# Patient Record
Sex: Female | Born: 1975 | Race: White | Hispanic: No | Marital: Married | Smoking: Former smoker
Health system: Southern US, Community
[De-identification: ages and names within clinical notes are randomized; demographics above are authoritative.]

## PROBLEM LIST (undated history)

## (undated) DIAGNOSIS — Z87442 Personal history of urinary calculi: Secondary | ICD-10-CM

## (undated) DIAGNOSIS — R51 Headache: Secondary | ICD-10-CM

## (undated) HISTORY — DX: Headache: R51

## (undated) HISTORY — PX: WISDOM TOOTH EXTRACTION: SHX21

---

## 2000-11-17 ENCOUNTER — Encounter: Payer: Self-pay | Admitting: Emergency Medicine

## 2000-11-17 ENCOUNTER — Emergency Department (HOSPITAL_COMMUNITY): Admission: EM | Admit: 2000-11-17 | Discharge: 2000-11-17 | Payer: Self-pay | Admitting: Emergency Medicine

## 2001-07-02 ENCOUNTER — Other Ambulatory Visit: Admission: RE | Admit: 2001-07-02 | Discharge: 2001-07-02 | Payer: Self-pay | Admitting: Family Medicine

## 2002-06-29 ENCOUNTER — Other Ambulatory Visit: Admission: RE | Admit: 2002-06-29 | Discharge: 2002-06-29 | Payer: Self-pay | Admitting: Family Medicine

## 2003-06-07 ENCOUNTER — Other Ambulatory Visit: Admission: RE | Admit: 2003-06-07 | Discharge: 2003-06-07 | Payer: Self-pay | Admitting: Family Medicine

## 2004-02-25 ENCOUNTER — Emergency Department (HOSPITAL_COMMUNITY): Admission: EM | Admit: 2004-02-25 | Discharge: 2004-02-26 | Payer: Self-pay | Admitting: Family Medicine

## 2004-04-17 ENCOUNTER — Ambulatory Visit: Payer: Self-pay | Admitting: Family Medicine

## 2004-05-04 ENCOUNTER — Other Ambulatory Visit: Admission: RE | Admit: 2004-05-04 | Discharge: 2004-05-04 | Payer: Self-pay | Admitting: Family Medicine

## 2004-05-04 ENCOUNTER — Ambulatory Visit: Payer: Self-pay | Admitting: Family Medicine

## 2004-05-08 ENCOUNTER — Ambulatory Visit: Payer: Self-pay | Admitting: Family Medicine

## 2004-05-14 ENCOUNTER — Encounter: Admission: RE | Admit: 2004-05-14 | Discharge: 2004-05-14 | Payer: Self-pay | Admitting: Family Medicine

## 2004-07-12 ENCOUNTER — Encounter: Admission: RE | Admit: 2004-07-12 | Discharge: 2004-07-12 | Payer: Self-pay | Admitting: Family Medicine

## 2004-08-01 ENCOUNTER — Ambulatory Visit: Payer: Self-pay | Admitting: Family Medicine

## 2004-09-04 ENCOUNTER — Emergency Department (HOSPITAL_COMMUNITY): Admission: EM | Admit: 2004-09-04 | Discharge: 2004-09-04 | Payer: Self-pay | Admitting: Emergency Medicine

## 2004-09-11 ENCOUNTER — Ambulatory Visit: Payer: Self-pay | Admitting: Family Medicine

## 2004-09-12 ENCOUNTER — Encounter: Admission: RE | Admit: 2004-09-12 | Discharge: 2004-09-12 | Payer: Self-pay | Admitting: Family Medicine

## 2004-10-19 ENCOUNTER — Ambulatory Visit: Payer: Self-pay | Admitting: Family Medicine

## 2004-12-25 ENCOUNTER — Ambulatory Visit: Payer: Self-pay | Admitting: Family Medicine

## 2004-12-31 ENCOUNTER — Encounter: Admission: RE | Admit: 2004-12-31 | Discharge: 2004-12-31 | Payer: Self-pay | Admitting: Family Medicine

## 2005-04-23 ENCOUNTER — Ambulatory Visit: Payer: Self-pay | Admitting: Family Medicine

## 2005-07-09 ENCOUNTER — Ambulatory Visit: Payer: Self-pay | Admitting: Family Medicine

## 2005-07-10 ENCOUNTER — Ambulatory Visit: Payer: Self-pay | Admitting: Family Medicine

## 2005-07-17 ENCOUNTER — Other Ambulatory Visit: Admission: RE | Admit: 2005-07-17 | Discharge: 2005-07-17 | Payer: Self-pay | Admitting: Family Medicine

## 2005-07-17 ENCOUNTER — Ambulatory Visit: Payer: Self-pay | Admitting: Family Medicine

## 2005-07-17 ENCOUNTER — Encounter: Payer: Self-pay | Admitting: Family Medicine

## 2005-09-18 ENCOUNTER — Ambulatory Visit: Payer: Self-pay | Admitting: Family Medicine

## 2005-12-31 ENCOUNTER — Ambulatory Visit: Payer: Self-pay | Admitting: Family Medicine

## 2006-06-05 ENCOUNTER — Ambulatory Visit: Payer: Self-pay | Admitting: Internal Medicine

## 2006-07-15 ENCOUNTER — Ambulatory Visit: Payer: Self-pay | Admitting: Family Medicine

## 2006-07-30 ENCOUNTER — Ambulatory Visit: Payer: Self-pay | Admitting: Family Medicine

## 2006-07-30 LAB — CONVERTED CEMR LAB
ALT: 11 units/L (ref 0–40)
AST: 20 units/L (ref 0–37)
Albumin: 3.1 g/dL — ABNORMAL LOW (ref 3.5–5.2)
Basophils Absolute: 0 10*3/uL (ref 0.0–0.1)
CO2: 31 meq/L (ref 19–32)
Calcium: 8.6 mg/dL (ref 8.4–10.5)
Eosinophils Relative: 2.7 % (ref 0.0–5.0)
GFR calc Af Amer: 153 mL/min
GFR calc non Af Amer: 127 mL/min
Glucose, Bld: 100 mg/dL — ABNORMAL HIGH (ref 70–99)
Hemoglobin: 13.5 g/dL (ref 12.0–15.0)
LDL Cholesterol: 116 mg/dL — ABNORMAL HIGH (ref 0–99)
MCHC: 35 g/dL (ref 30.0–36.0)
Monocytes Relative: 9.6 % (ref 3.0–11.0)
RBC: 4.59 M/uL (ref 3.87–5.11)
RDW: 11.8 % (ref 11.5–14.6)
Sodium: 143 meq/L (ref 135–145)
TSH: 2.14 microintl units/mL (ref 0.35–5.50)
Total Bilirubin: 0.4 mg/dL (ref 0.3–1.2)
Triglycerides: 87 mg/dL (ref 0–149)

## 2006-08-05 ENCOUNTER — Encounter: Payer: Self-pay | Admitting: Family Medicine

## 2006-08-05 ENCOUNTER — Ambulatory Visit: Payer: Self-pay | Admitting: Family Medicine

## 2006-08-05 ENCOUNTER — Other Ambulatory Visit: Admission: RE | Admit: 2006-08-05 | Discharge: 2006-08-05 | Payer: Self-pay | Admitting: Family Medicine

## 2006-08-12 ENCOUNTER — Encounter: Payer: Self-pay | Admitting: Family Medicine

## 2006-10-13 ENCOUNTER — Encounter: Payer: Self-pay | Admitting: Family Medicine

## 2006-10-13 DIAGNOSIS — R519 Headache, unspecified: Secondary | ICD-10-CM | POA: Insufficient documentation

## 2006-10-13 DIAGNOSIS — N390 Urinary tract infection, site not specified: Secondary | ICD-10-CM | POA: Insufficient documentation

## 2006-10-13 DIAGNOSIS — R51 Headache: Secondary | ICD-10-CM | POA: Insufficient documentation

## 2006-10-13 DIAGNOSIS — J309 Allergic rhinitis, unspecified: Secondary | ICD-10-CM | POA: Insufficient documentation

## 2006-11-12 ENCOUNTER — Telehealth: Payer: Self-pay | Admitting: Family Medicine

## 2006-11-14 ENCOUNTER — Ambulatory Visit: Payer: Self-pay | Admitting: Family Medicine

## 2006-11-14 DIAGNOSIS — B373 Candidiasis of vulva and vagina: Secondary | ICD-10-CM | POA: Insufficient documentation

## 2006-11-14 DIAGNOSIS — B009 Herpesviral infection, unspecified: Secondary | ICD-10-CM | POA: Insufficient documentation

## 2007-07-28 ENCOUNTER — Ambulatory Visit: Payer: Self-pay | Admitting: Family Medicine

## 2007-07-30 ENCOUNTER — Telehealth: Payer: Self-pay | Admitting: Family Medicine

## 2007-08-03 ENCOUNTER — Inpatient Hospital Stay (HOSPITAL_COMMUNITY): Admission: AD | Admit: 2007-08-03 | Discharge: 2007-08-03 | Payer: Self-pay | Admitting: Obstetrics and Gynecology

## 2007-08-05 ENCOUNTER — Inpatient Hospital Stay (HOSPITAL_COMMUNITY): Admission: AD | Admit: 2007-08-05 | Discharge: 2007-08-05 | Payer: Self-pay | Admitting: Obstetrics and Gynecology

## 2007-08-08 ENCOUNTER — Inpatient Hospital Stay (HOSPITAL_COMMUNITY): Admission: AD | Admit: 2007-08-08 | Discharge: 2007-08-08 | Payer: Self-pay | Admitting: Gynecology

## 2008-02-16 ENCOUNTER — Telehealth: Payer: Self-pay | Admitting: Family Medicine

## 2008-05-02 ENCOUNTER — Ambulatory Visit: Payer: Self-pay | Admitting: Family Medicine

## 2008-05-02 DIAGNOSIS — S8010XA Contusion of unspecified lower leg, initial encounter: Secondary | ICD-10-CM | POA: Insufficient documentation

## 2008-05-02 DIAGNOSIS — B353 Tinea pedis: Secondary | ICD-10-CM | POA: Insufficient documentation

## 2008-05-02 DIAGNOSIS — T148XXA Other injury of unspecified body region, initial encounter: Secondary | ICD-10-CM | POA: Insufficient documentation

## 2008-06-22 IMAGING — US US OB TRANSVAGINAL MODIFY
1 series · 14 of 28 positions shown · non-contrast
Comparison: None

CLINICAL DATA: The patient pregnant with quantitative beta HCG
reportedly 73.  Last menstrual period of 07/10/2007 with estimated
gestational age by LMP of 3 weeks and 5 days.

TRANSABDOMINAL AND TRANSVAGINAL ULTRASOUND OF PELVIS
TECHNIQUE: Both transabdominal and transvaginal ultrasound
examinations of the pelvis were performed including evaluation of
the uterus, ovaries, adnexal regions, and pelvic cul-de-sac.

[Series 1: us ob comp less 14 wks · 14 of 41 slices shown]
[im 2/41]
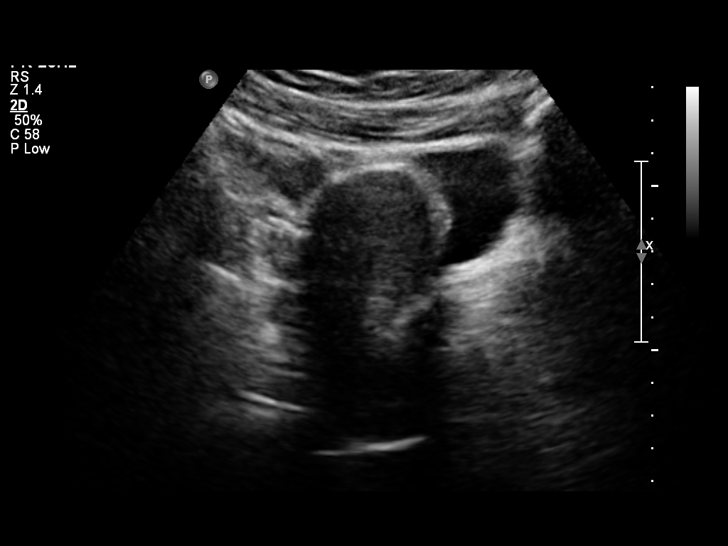
[im 5/41]
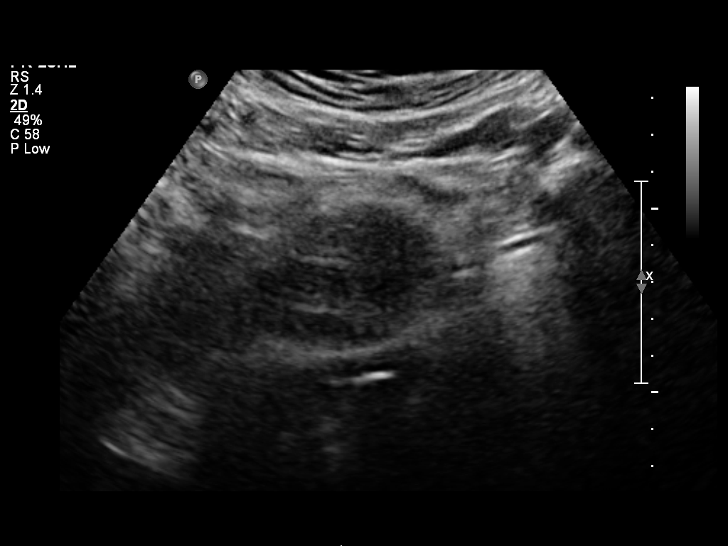
[im 8/41]
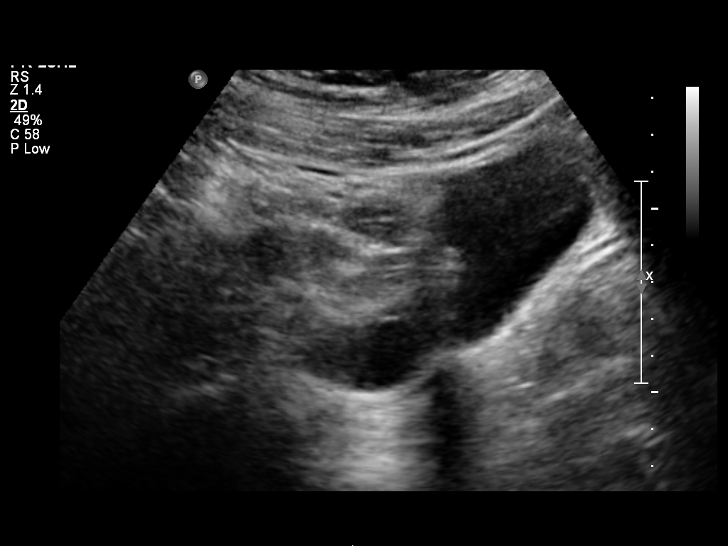
[im 11/41]
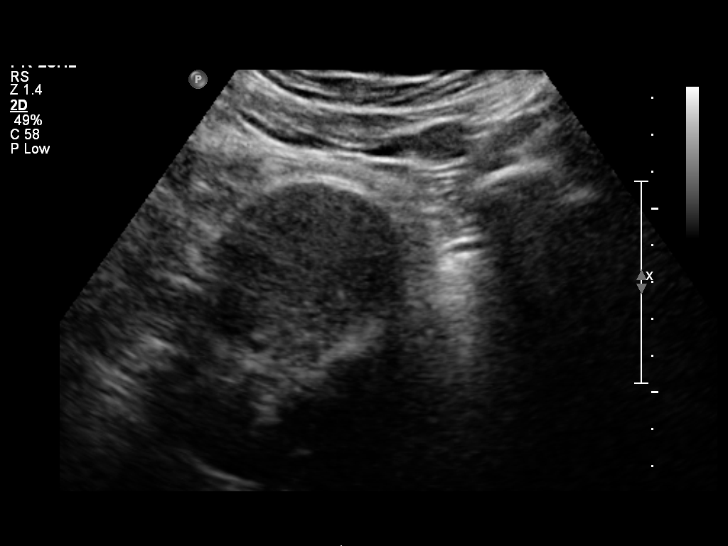
[im 14/41]
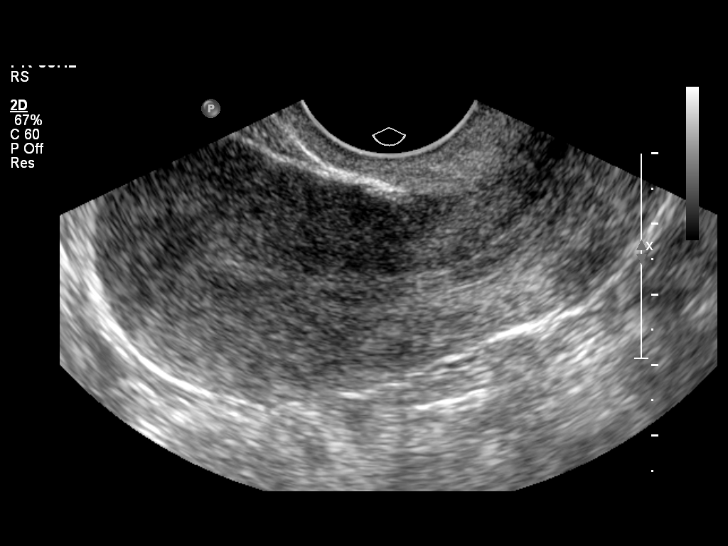
[im 17/41]
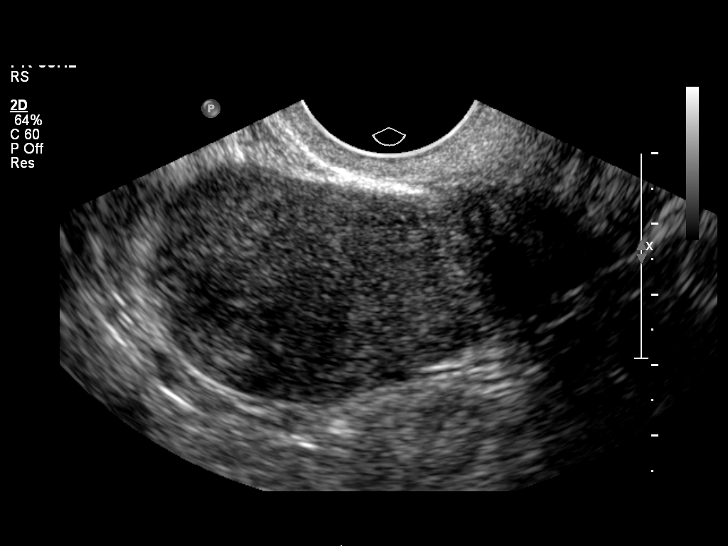
[im 20/41]
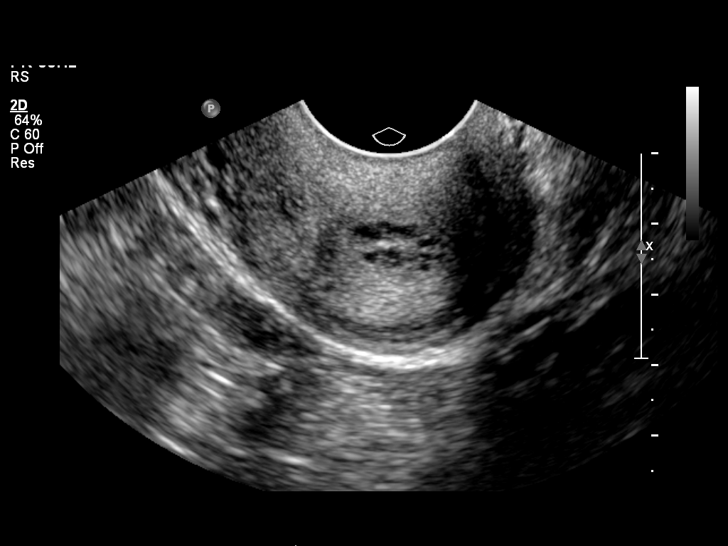
[im 23/41]
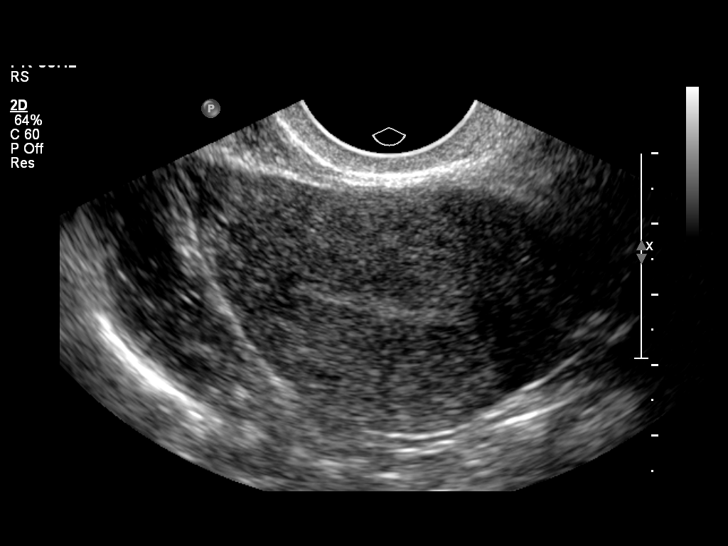
[im 26/41]
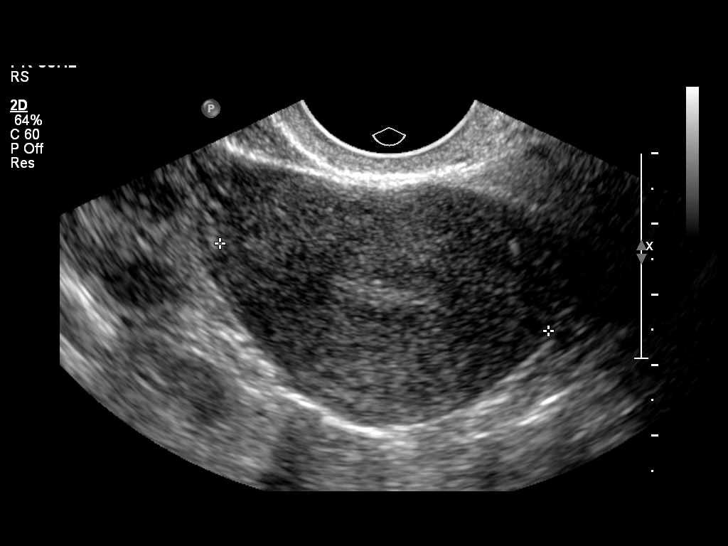
[im 29/41]
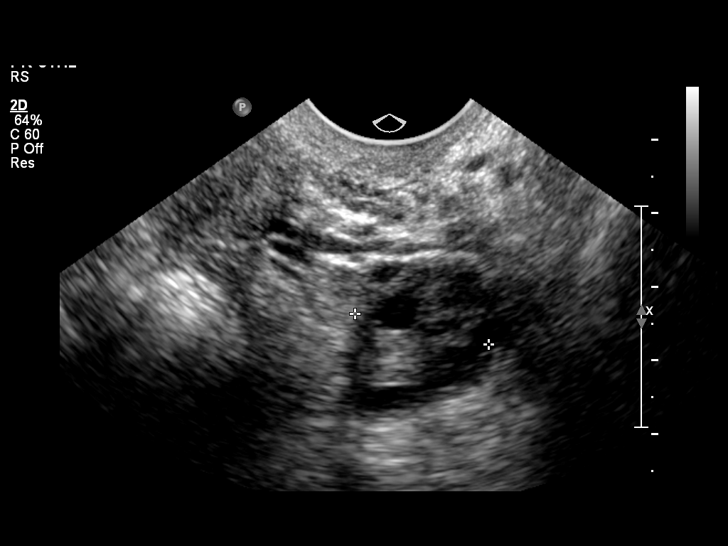
[im 32/41]
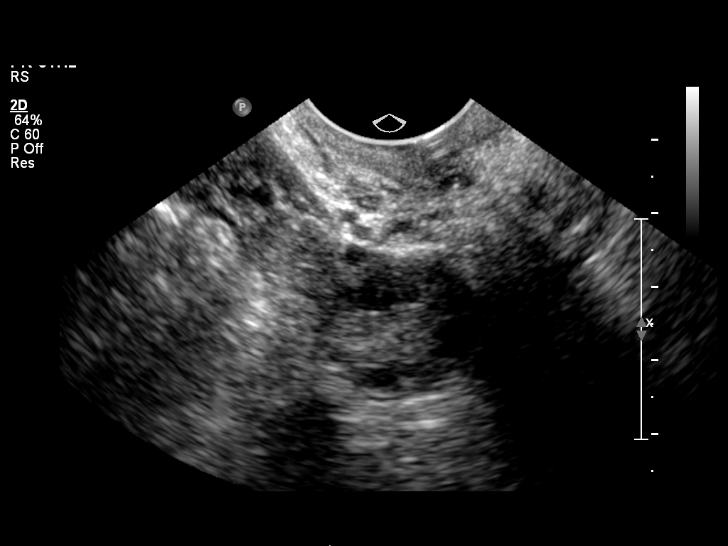
[im 35/41]
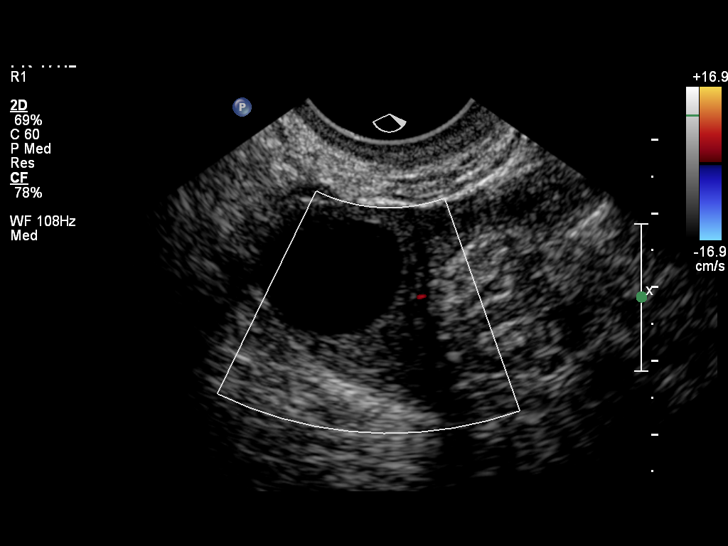
[im 38/41]
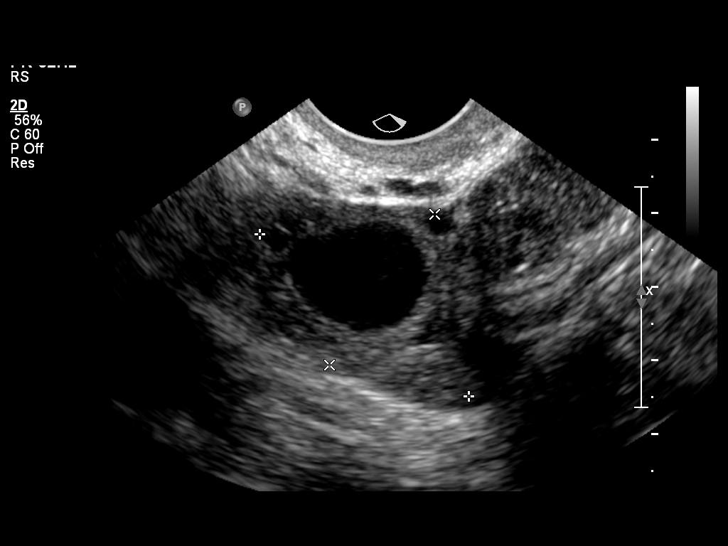
[im 41/41]
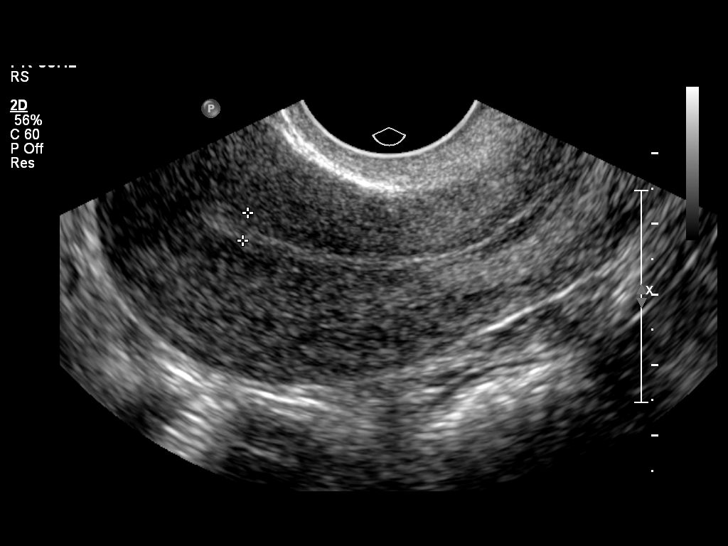

[14 of 28 positions shown; findings below may reference images not displayed]

FINDINGS: No intrauterine gestational sac is identified.  The
endometrial stripe is 4 mm.  No focal uterine mass is identified.

The right ovary is 3.6 x 2.5 x 2.4 cm and contains a 1.9 x 1.5 by
1.8 cm dominant follicle versus corpus luteum.

No free fluid is identified in the pelvis. No adnexal mass is seen.
IMPRESSION: 1.  No intrauterine gestational sac is identified at this time.
The differential diagnosis includes early normal IUP, spontaneous
abortion or ectopic pregnancy.  Correlation with follow-up Beta HCG
levels and follow-up ultrasound as indicated is suggested.

## 2009-10-20 ENCOUNTER — Ambulatory Visit: Payer: Self-pay | Admitting: Family Medicine

## 2009-10-20 DIAGNOSIS — S335XXA Sprain of ligaments of lumbar spine, initial encounter: Secondary | ICD-10-CM | POA: Insufficient documentation

## 2009-10-26 ENCOUNTER — Ambulatory Visit: Payer: Self-pay | Admitting: Family Medicine

## 2009-10-26 DIAGNOSIS — M5430 Sciatica, unspecified side: Secondary | ICD-10-CM | POA: Insufficient documentation

## 2009-10-26 DIAGNOSIS — S336XXA Sprain of sacroiliac joint, initial encounter: Secondary | ICD-10-CM | POA: Insufficient documentation

## 2010-05-01 ENCOUNTER — Ambulatory Visit (HOSPITAL_COMMUNITY)
Admission: RE | Admit: 2010-05-01 | Discharge: 2010-05-01 | Payer: Self-pay | Source: Home / Self Care | Attending: Obstetrics and Gynecology | Admitting: Obstetrics and Gynecology

## 2010-05-22 NOTE — Assessment & Plan Note (Signed)
Summary: BACK PAIN // RS   Vital Signs:  Patient profile:   35 year old female Weight:      222 pounds BP sitting:   112 / 78  (left arm) Cuff size:   large  Vitals Entered By: Raechel Ache, RN (October 20, 2009 9:42 AM) CC: C/o LBP on left.   History of Present Illness: Here for an injury to the lower back which occurred at work 2 days ago. While bending over to push an empty trash can under a counter, she felt a pulling in the left lower back. later that day the pain became more severe, and her back began to spasm. She stayed home yesterday and rested, used heat, and took Ibuprofen. today it is better but still stiff. No pain in the legs. This has never happened before.   Allergies: 1)  ! Codeine  Past History:  Past Medical History: Reviewed history from 10/13/2006 and no changes required. Allergic rhinitis Headache  Past Surgical History: Reviewed history from 10/13/2006 and no changes required. Denies surgical history  Review of Systems  The patient denies anorexia, fever, weight loss, weight gain, vision loss, decreased hearing, hoarseness, chest pain, syncope, dyspnea on exertion, peripheral edema, prolonged cough, headaches, hemoptysis, abdominal pain, melena, hematochezia, severe indigestion/heartburn, hematuria, incontinence, genital sores, muscle weakness, suspicious skin lesions, transient blindness, difficulty walking, depression, unusual weight change, abnormal bleeding, enlarged lymph nodes, angioedema, breast masses, and testicular masses.    Physical Exam  General:  Well-developed,well-nourished,in no acute distress; alert,appropriate and cooperative throughout examination Msk:  tender in the left lower back, full ROM and negative SLR   Impression & Recommendations:  Problem # 1:  BACK STRAIN, LUMBAR (ICD-847.2)  Complete Medication List: 1)  Centrum Ultra Womens Tabs (Multiple vitamins-minerals) .... One daily 2)  Lamisil 250 Mg Tabs (Terbinafine hcl)  .... Once daily 3)  Flexeril 10 Mg Tabs (Cyclobenzaprine hcl) .... Three times a day as needed spasm  Patient Instructions: 1)  add Flexeril as needed .  2)  Please schedule a follow-up appointment as needed .  Prescriptions: FLEXERIL 10 MG TABS (CYCLOBENZAPRINE HCL) three times a day as needed spasm  #60 x 5   Entered and Authorized by:   Nelwyn Salisbury MD   Signed by:   Nelwyn Salisbury MD on 10/20/2009   Method used:   [facility]  [address] [phone][facility] (retail[facility]       [address]       [place], [place]  [address]       Ph: [phone]       Fax: [phone]   RxID:   [id number]

## 2010-05-22 NOTE — Assessment & Plan Note (Signed)
Summary: increasing back pain with leg numbness/dm   Vital Signs:  Patient profile:   35 year old female Weight:      225 pounds O2 Sat:      96 % Temp:     98.2 degrees F Pulse rate:   90 / minute Pulse rhythm:   regular BP sitting:   100 / 78  (left arm) Cuff size:   large  Vitals Entered By: Pura Spice, RN (October 26, 2009 3:32 PM) CC: saw dr fry 1 wk ago on flexeeril for pulled muscle left back area  states getting worse some tingling left lateral leg., Abdominal Pain   History of Present Illness: 35 year old white married female has had back pain over the past week was seen last week and he observed muscle relaxer pain improved initially within increase in severe knee and is much worse at this time. Plan no lumbar spine with some radiation into the left leg Patient is employed by the [employer] but is unable work at this time  Allergies: 1)  ! Codeine  Past History:  Past Medical History: Last updated: 10/13/2006 Allergic rhinitis Headache  Review of Systems      See HPI  The patient denies anorexia, fever, weight loss, weight gain, vision loss, decreased hearing, hoarseness, chest pain, syncope, dyspnea on exertion, peripheral edema, prolonged cough, headaches, hemoptysis, abdominal pain, melena, hematochezia, severe indigestion/heartburn, hematuria, incontinence, genital sores, muscle weakness, suspicious skin lesions, transient blindness, difficulty walking, depression, unusual weight change, abnormal bleeding, enlarged lymph nodes, angioedema, breast masses, and testicular masses.    Physical Exam  General:  Well-developed,well-nourished,in no acute distress; alert,appropriate and cooperative throughout examinationtears to be in pain Lungs:  Normal respiratory effort, chest expands symmetrically. Lungs are clear to auscultation, no crackles or wheezes. Heart:  Normal rate and regular rhythm. S1 and S2 normal without gallop, murmur, click, rub or other extra  sounds. Abdomen:  Bowel sounds positive,abdomen soft and non-tender without masses, organomegaly or hernias noted. Msk:  muscle spasm improved however tender lower lumbar spine as well as both SI joints with the left being greater than the right No limitation of straight leg raises   Impression & Recommendations:  Problem # 1:  SCIATICA, ACUTE (ICD-724.3) Assessment New  Her updated medication list for this problem includes:    Flexeril 10 Mg Tabs (Cyclobenzaprine hcl) .Marland Kitchen... Three times a day as needed spasm    Hydrocodone-acetaminophen 7.5-500 Mg Tabs (Hydrocodone-acetaminophen) .Marland Kitchen... 1 q4h as needed pain    Diclofenac Sodium 75 Mg Tbec (Diclofenac sodium) .Marland Kitchen... 1 two times a day for inflamation  Problem # 2:  SACROILIAC STRAIN, ACUTE (ICD-846.9) Assessment: New  Problem # 3:  BACK STRAIN, LUMBAR (ICD-847.2) Assessment: Deteriorated  Orders: Depo- Medrol 40mg  (J1030) Depo- Medrol 80mg  (J1040) Admin of Therapeutic Inj  intramuscular or subcutaneous (45409)  Complete Medication List: 1)  Centrum Ultra Womens Tabs (Multiple vitamins-minerals) .... One daily 2)  Lamisil 250 Mg Tabs (Terbinafine hcl) .... Once daily 3)  Flexeril 10 Mg Tabs (Cyclobenzaprine hcl) .... Three times a day as needed spasm 4)  Hydrocodone-acetaminophen 7.5-500 Mg Tabs (Hydrocodone-acetaminophen) .Marland Kitchen.. 1 q4h as needed pain 5)  Diclofenac Sodium 75 Mg Tbec (Diclofenac sodium) .Marland Kitchen.. 1 two times a day for inflamation   Patient Instructions: 1)  acute strain back. sacroiliac strain with radiation pain  left leg 2)  however no indication of herniated disc 3)  muscle spasm has improved ,use flexeril as neede especially at night 4)  Depomedrol 120  mg IM for inflammation 5)  diclofenac 75 mg AM and PM for inflammation continue even aftr you have stopped having pain 6)  hydrocodon  for pain 7)  cold pack 20 mintes, wait 5-10 minutes then apply heat Prescriptions: DICLOFENAC SODIUM 75 MG TBEC (DICLOFENAC SODIUM)  1 two times a day for inflamation  #60 x 1   Entered and Authorized by:   Judithann Sheen MD   Signed by:   Judithann Sheen MD on 10/26/2009   Method used:   [facility]  [address] [id number]       [address]       [place], [place]  [address]       Ph: [phone]       Fax: [phone]   RxID:   [id number] HYDROCODONE-ACETAMINOPHEN 7.5-500 MG TABS (HYDROCODONE-ACETAMINOPHEN) 1 q4h as needed pain  #50 x 1   Entered and Authorized by:   Judithann Sheen MD   Signed by:   Judithann Sheen MD on 10/26/2009   Method used:   Print then Give to Patient   RxID:   [id number]    Medication Administration  Injection # 1:    Medication: Depo- Medrol 40mg     Diagnosis: BACK STRAIN, LUMBAR (ICD-847.2)    Route: IM    Site: RUOQ gluteus    Exp Date: 07/2012    Lot #: [id number]    Mfr: Pharmacia    Patient tolerated injection without complications    Given by: Pura Spice, RN (October 26, 2009 4:45 PM)  Injection # 2:    Medication: Depo- Medrol 80mg     Diagnosis: BACK STRAIN, LUMBAR (ICD-847.2)    Route: IM    Site: RUOQ gluteus    Exp Date: 07/2012    Lot #: [id number]    Mfr: Pharmacia    Patient tolerated injection without complications    Given by: Pura Spice, RN (October 26, 2009 4:45 PM)  Orders Added: 1)  Depo- Medrol 40mg  [J1030] 2)  Depo- Medrol 80mg  [J1040] 3)  Admin of Therapeutic Inj  intramuscular or subcutaneous [96372] 4)  Est. Patient Level IV [34742]

## 2010-09-07 NOTE — Assessment & Plan Note (Signed)
[facility] OFFICE NOTE   Paula Kline, Paula Kline                   MRN:          [id number]  DATE:08/05/2006                            DOB:          [birth year]    This 35 year old woman is here for a complete physical examination.  In  general, she is doing well except for some recent onset of sharp  intermittent right lower quadrant pain.  She had a similar type of pain  in January of 2006 and came to [place].  Workup at that time revealed a 4.5  cm right ovarian cyst.  After switching her to a high dose birth control  pill, the pain went away over the next couple of months and the cyst  completely resolved when we evaluated it a few months later.  Now for  the past 4 weeks, she has had a similar type of intermittent pain.  Her  periods have been normal, bowel movements normal, no urinary symptoms,  no fever, no nausea, etc.  Otherwise her migraine headaches remain under  fairly good control.  She averages only 2-3 a year.  For further details  of her past medical history, family history, social history, habits,  etc., I refer you to our last physical note dated July 17, 2005.   ALLERGIES:  CODEINE and MORPHINE.   CURRENT MEDICATIONS:  1. Tri-Sprintec daily.  2. Zyrtec daily as needed.  3. Maxalt 10 mg as needed.   OBJECTIVE:  VITAL SIGNS:  Height 66 inches, weight 206, blood pressure  112/78, pulse 74 and regular.  GENERAL:  She remains quite overweight.  SKIN:  Clear.  HEENT:  Eyes; sclerae clear.  Oropharynx clear.  NECK:  Supple without lymphadenopathy or masses.  LUNGS:  Clear.  HEART:  Rate and rhythm regular.  Without murmurs or rubs or gallops.  Pulses full.  ABDOMEN:  Soft with normal bowel sounds.  Nontender with no masses.  PELVIC:  External genitalia within normal limits.  Vagina clear, cervix  is clear.  Pap smear is obtained.  Uterus is not enlarged.  Adnexa  revealed no masses, although,  the right ovary is slightly tender.  EXTREMITIES:  No cyanosis, clubbing, or edema.  NEUROLOGY:  Grossly intact.   She was here for fasting labs on April 9 and these were all within  normal limits.   ASSESSMENT:  1. Complete physical.  We talked about exercising more and losing      weight.  2. Allergies stable.  3. Migraine headaches stable.  4. Right lower quadrant pain, probably due to a recurrent right      ovarian cyst.  At this point, the patient did not want further      workup and specifically did not want a pelvic ultrasound, but      instead she did want to try high dose birth control pills since it      was successful before.  Therefore we will stop Tri-Sprintec and      switch to Ovcon-50 daily.  She will follow up with me if the pain  does not resolve over the next month or so or if it gets worse.     Tera Mater. Clent Ridges, MD  Electronically Signed    SAF/MedQ  DD: 08/06/2006  DT: 08/06/2006  Job #: [id number]

## 2010-09-10 ENCOUNTER — Other Ambulatory Visit: Payer: Self-pay | Admitting: Obstetrics and Gynecology

## 2010-09-20 ENCOUNTER — Other Ambulatory Visit: Payer: Self-pay | Admitting: Obstetrics and Gynecology

## 2010-12-14 ENCOUNTER — Ambulatory Visit: Payer: Self-pay | Admitting: Family Medicine

## 2011-01-15 ENCOUNTER — Ambulatory Visit (INDEPENDENT_AMBULATORY_CARE_PROVIDER_SITE_OTHER): Payer: BC Managed Care – PPO | Admitting: Family Medicine

## 2011-01-15 ENCOUNTER — Encounter: Payer: Self-pay | Admitting: Family Medicine

## 2011-01-15 VITALS — BP 118/70 | HR 79 | Temp 98.4°F | Wt 218.0 lb

## 2011-01-15 DIAGNOSIS — K602 Anal fissure, unspecified: Secondary | ICD-10-CM

## 2011-01-15 LAB — CBC
Hemoglobin: 13.9
MCHC: 34.7
MCV: 84.3
RBC: 4.76
WBC: 7.8

## 2011-01-15 NOTE — Progress Notes (Signed)
  Subjective:    Patient ID: Paula Kline, female    DOB: 1975-11-14, 35 y.o.   MRN: [id number]  HPI Here for 2 weeks of painful BMs and seeing a small amount of bright red blood on the paper and and the stool. Her stools are slightly harder than usual. No abdominal complaints. Using Dulculax daily.    Review of Systems  Constitutional: Negative.   Gastrointestinal: Positive for blood in stool, anal bleeding and rectal pain. Negative for nausea, vomiting, abdominal pain, diarrhea, constipation and abdominal distention.       Objective:   Physical Exam  Constitutional: She appears well-developed and well-nourished.  Abdominal: Soft. Bowel sounds are normal. She exhibits no distension and no mass. There is no tenderness. There is no rebound and no guarding.       The anus has a medium sized fissure at the verge           Assessment & Plan:  Try Miralax twice daily to soften the stools. Use  Glycerin suppository prn . Drink plenty of water. Recheck prn

## 2013-06-08 LAB — OB RESULTS CONSOLE HEPATITIS B SURFACE ANTIGEN: HEP B S AG: NEGATIVE

## 2013-06-08 LAB — OB RESULTS CONSOLE HIV ANTIBODY (ROUTINE TESTING): HIV: NONREACTIVE

## 2013-06-08 LAB — OB RESULTS CONSOLE RUBELLA ANTIBODY, IGM: RUBELLA: IMMUNE

## 2013-11-10 ENCOUNTER — Encounter (HOSPITAL_COMMUNITY): Payer: Self-pay | Admitting: Pharmacy Technician

## 2013-11-12 ENCOUNTER — Encounter (HOSPITAL_COMMUNITY): Payer: Self-pay

## 2013-11-12 NOTE — Pre-Procedure Instructions (Signed)
No prenatal lab results found for this pregnancy.

## 2013-11-15 ENCOUNTER — Inpatient Hospital Stay (HOSPITAL_COMMUNITY)
Admission: RE | Admit: 2013-11-15 | Discharge: 2013-11-15 | Disposition: A | Discharge status: Home / Self Care | Payer: BC Managed Care – PPO | Source: Ambulatory Visit

## 2013-11-15 ENCOUNTER — Encounter (HOSPITAL_COMMUNITY): Payer: Self-pay

## 2013-11-15 ENCOUNTER — Encounter (HOSPITAL_COMMUNITY)
Admission: RE | Admit: 2013-11-15 | Discharge: 2013-11-15 | Disposition: A | Discharge status: Home / Self Care | Payer: 59 | Source: Ambulatory Visit | Attending: Obstetrics and Gynecology | Admitting: Obstetrics and Gynecology

## 2013-11-15 HISTORY — DX: Personal history of urinary calculi: Z87.442

## 2013-11-15 LAB — CBC
HCT: 36 % (ref 36.0–46.0)
Hemoglobin: 12.3 g/dL (ref 12.0–15.0)
MCH: 28.7 pg (ref 26.0–34.0)
MCHC: 34.2 g/dL (ref 30.0–36.0)
MCV: 83.9 fL (ref 78.0–100.0)
Platelets: 144 K/uL — ABNORMAL LOW (ref 150–400)
RBC: 4.29 MIL/uL (ref 3.87–5.11)
RDW: 14.7 % (ref 11.5–15.5)
WBC: 8.4 K/uL (ref 4.0–10.5)

## 2013-11-15 LAB — RPR

## 2013-11-15 NOTE — Patient Instructions (Addendum)
   Your procedure is scheduled on:  Tuesday, July 28  [facility] at: 930 AM Pick up the phone at the desk and dial [phone] and inform us of your arrival.  Please call this number if you have any problems the morning of surgery: [phone]  Remember: Do not eat food after midnight: Monday Do not drink clear liquids after: 7 AM Take these medicines the morning of surgery with a SIP OF WATER:  none  Do not wear jewelry, make-up, or FINGER nail polish No metal in your hair or on your body. Do not wear lotions, powders, perfumes.  You may wear deodorant.  Do not bring valuables to the hospital. Contacts, dentures or bridgework may not be worn into surgery.  Leave suitcase in the car. After Surgery it may be brought to your room. For patients being admitted to the hospital, checkout time is 11:00am the day of discharge.  Homer with husband Peyton NajjarLarry cell [phone].

## 2013-11-16 ENCOUNTER — Encounter (HOSPITAL_COMMUNITY): Payer: 59 | Admitting: Anesthesiology

## 2013-11-16 ENCOUNTER — Encounter (HOSPITAL_COMMUNITY)
Admission: RE | Disposition: A | Discharge status: Home / Self Care | Payer: Self-pay | Source: Ambulatory Visit | Attending: Obstetrics and Gynecology

## 2013-11-16 ENCOUNTER — Inpatient Hospital Stay (HOSPITAL_COMMUNITY)
Admission: RE | Admit: 2013-11-16 | Discharge: 2013-11-18 | DRG: 765 | Disposition: A | Discharge status: Home / Self Care | Payer: 59 | Source: Ambulatory Visit | Attending: Obstetrics and Gynecology | Admitting: Obstetrics and Gynecology

## 2013-11-16 ENCOUNTER — Inpatient Hospital Stay (HOSPITAL_COMMUNITY): Payer: 59 | Admitting: Anesthesiology

## 2013-11-16 ENCOUNTER — Encounter (HOSPITAL_COMMUNITY): Payer: Self-pay

## 2013-11-16 DIAGNOSIS — Z6841 Body Mass Index (BMI) 40.0 and over, adult: Secondary | ICD-10-CM | POA: Diagnosis not present

## 2013-11-16 DIAGNOSIS — O34219 Maternal care for unspecified type scar from previous cesarean delivery: Secondary | ICD-10-CM | POA: Diagnosis present

## 2013-11-16 DIAGNOSIS — O99214 Obesity complicating childbirth: Secondary | ICD-10-CM

## 2013-11-16 DIAGNOSIS — Z87891 Personal history of nicotine dependence: Secondary | ICD-10-CM | POA: Diagnosis not present

## 2013-11-16 DIAGNOSIS — O09529 Supervision of elderly multigravida, unspecified trimester: Secondary | ICD-10-CM | POA: Diagnosis present

## 2013-11-16 DIAGNOSIS — Z87442 Personal history of urinary calculi: Secondary | ICD-10-CM

## 2013-11-16 DIAGNOSIS — G709 Myoneural disorder, unspecified: Secondary | ICD-10-CM | POA: Diagnosis present

## 2013-11-16 DIAGNOSIS — E669 Obesity, unspecified: Secondary | ICD-10-CM | POA: Diagnosis present

## 2013-11-16 LAB — PREPARE RBC (CROSSMATCH)

## 2013-11-16 SURGERY — Surgical Case
Anesthesia: Spinal

## 2013-11-16 MED ORDER — PHENYLEPHRINE 40 MCG/ML (10ML) SYRINGE FOR IV PUSH (FOR BLOOD PRESSURE SUPPORT)
PREFILLED_SYRINGE | INTRAVENOUS | Status: AC
  Filled 2013-11-16: qty 5

## 2013-11-16 MED ORDER — DEXAMETHASONE SODIUM PHOSPHATE 4 MG/ML IJ SOLN
INTRAMUSCULAR | Status: DC | PRN
  Administered 2013-11-16: 4 mg via INTRAVENOUS

## 2013-11-16 MED ORDER — ONDANSETRON HCL 4 MG/2ML IJ SOLN
INTRAMUSCULAR | Status: AC
  Filled 2013-11-16: qty 2

## 2013-11-16 MED ORDER — MEPERIDINE HCL 25 MG/ML IJ SOLN: 6.2500 mg | INTRAMUSCULAR | Status: DC | PRN

## 2013-11-16 MED ORDER — DEXAMETHASONE SODIUM PHOSPHATE 10 MG/ML IJ SOLN
INTRAMUSCULAR | Status: AC
  Filled 2013-11-16: qty 1

## 2013-11-16 MED ORDER — OXYTOCIN 10 UNIT/ML IJ SOLN
INTRAMUSCULAR | Status: AC
  Filled 2013-11-16: qty 4

## 2013-11-16 MED ORDER — LACTATED RINGERS IV SOLN
INTRAVENOUS | Status: DC
  Administered 2013-11-16: 12:00:00 via INTRAVENOUS

## 2013-11-16 MED ORDER — PHENYLEPHRINE 8 MG IN D5W 100 ML (0.08MG/ML) PREMIX OPTIME
INJECTION | INTRAVENOUS | Status: AC
  Filled 2013-11-16: qty 100

## 2013-11-16 MED ORDER — MORPHINE SULFATE 0.5 MG/ML IJ SOLN
INTRAMUSCULAR | Status: AC
  Filled 2013-11-16: qty 10

## 2013-11-16 MED ORDER — KETOROLAC TROMETHAMINE 30 MG/ML IJ SOLN: 30.0000 mg | Freq: Four times a day (QID) | INTRAMUSCULAR | Status: AC | PRN

## 2013-11-16 MED ORDER — DIPHENHYDRAMINE HCL 25 MG PO CAPS
25.0000 mg | ORAL_CAPSULE | ORAL | Status: DC | PRN
  Administered 2013-11-17 (×2): 25 mg via ORAL
  Filled 2013-11-16: qty 1

## 2013-11-16 MED ORDER — DIPHENHYDRAMINE HCL 25 MG PO CAPS
25.0000 mg | ORAL_CAPSULE | Freq: Four times a day (QID) | ORAL | Status: DC | PRN
  Filled 2013-11-16: qty 1

## 2013-11-16 MED ORDER — TETANUS-DIPHTH-ACELL PERTUSSIS 5-2.5-18.5 LF-MCG/0.5 IM SUSP
0.5000 mL | Freq: Once | INTRAMUSCULAR | Status: AC
  Administered 2013-11-17: 0.5 mL via INTRAMUSCULAR
  Filled 2013-11-16: qty 0.5

## 2013-11-16 MED ORDER — DIPHENHYDRAMINE HCL 50 MG/ML IJ SOLN
INTRAMUSCULAR | Status: DC | PRN
  Administered 2013-11-16 (×2): 12.5 mg via INTRAVENOUS

## 2013-11-16 MED ORDER — OXYCODONE-ACETAMINOPHEN 5-325 MG PO TABS
1.0000 | ORAL_TABLET | ORAL | Status: DC | PRN
  Administered 2013-11-17 (×2): 1 via ORAL
  Administered 2013-11-18 (×3): 2 via ORAL
  Filled 2013-11-16: qty 2
  Filled 2013-11-16 (×2): qty 1
  Filled 2013-11-16 (×2): qty 2

## 2013-11-16 MED ORDER — SIMETHICONE 80 MG PO CHEW
80.0000 mg | CHEWABLE_TABLET | Freq: Three times a day (TID) | ORAL | Status: DC
  Administered 2013-11-16 – 2013-11-18 (×5): 80 mg via ORAL
  Filled 2013-11-16 (×5): qty 1

## 2013-11-16 MED ORDER — KETOROLAC TROMETHAMINE 30 MG/ML IJ SOLN
30.0000 mg | Freq: Four times a day (QID) | INTRAMUSCULAR | Status: AC | PRN
  Administered 2013-11-16: 30 mg via INTRAVENOUS

## 2013-11-16 MED ORDER — ONDANSETRON HCL 4 MG/2ML IJ SOLN: 4.0000 mg | Freq: Three times a day (TID) | INTRAMUSCULAR | Status: DC | PRN

## 2013-11-16 MED ORDER — LACTATED RINGERS IV SOLN
40.0000 [IU] | INTRAVENOUS | Status: DC | PRN
  Administered 2013-11-16: 40 [IU] via INTRAVENOUS

## 2013-11-16 MED ORDER — NALOXONE HCL 0.4 MG/ML IJ SOLN
0.4000 mg | INTRAMUSCULAR | Status: DC | PRN
Start: 2013-11-16 — End: 2013-11-18

## 2013-11-16 MED ORDER — SCOPOLAMINE 1 MG/3DAYS TD PT72
1.0000 | MEDICATED_PATCH | Freq: Once | TRANSDERMAL | Status: DC
  Filled 2013-11-16: qty 1

## 2013-11-16 MED ORDER — IBUPROFEN 600 MG PO TABS
600.0000 mg | ORAL_TABLET | Freq: Four times a day (QID) | ORAL | Status: DC
  Administered 2013-11-16 – 2013-11-17 (×3): 600 mg via ORAL
  Filled 2013-11-16 (×4): qty 1

## 2013-11-16 MED ORDER — MIDAZOLAM HCL 2 MG/2ML IJ SOLN: 0.5000 mg | Freq: Once | INTRAMUSCULAR | Status: DC | PRN

## 2013-11-16 MED ORDER — NALOXONE HCL 1 MG/ML IJ SOLN: 1.0000 ug/kg/h | INTRAVENOUS | Status: DC | PRN

## 2013-11-16 MED ORDER — LANOLIN HYDROUS EX OINT: 1.0000 "application " | TOPICAL_OINTMENT | CUTANEOUS | Status: DC | PRN

## 2013-11-16 MED ORDER — SIMETHICONE 80 MG PO CHEW: 80.0000 mg | CHEWABLE_TABLET | ORAL | Status: DC | PRN

## 2013-11-16 MED ORDER — DIPHENHYDRAMINE HCL 50 MG/ML IJ SOLN: 12.5000 mg | INTRAMUSCULAR | Status: DC | PRN

## 2013-11-16 MED ORDER — DIPHENHYDRAMINE HCL 50 MG/ML IJ SOLN: 25.0000 mg | INTRAMUSCULAR | Status: DC | PRN

## 2013-11-16 MED ORDER — ONDANSETRON HCL 4 MG/2ML IJ SOLN: 4.0000 mg | INTRAMUSCULAR | Status: DC | PRN

## 2013-11-16 MED ORDER — MORPHINE SULFATE (PF) 0.5 MG/ML IJ SOLN
INTRAMUSCULAR | Status: DC | PRN
  Administered 2013-11-16: .15 mg via INTRATHECAL

## 2013-11-16 MED ORDER — NALBUPHINE HCL 10 MG/ML IJ SOLN: 5.0000 mg | INTRAMUSCULAR | Status: DC | PRN

## 2013-11-16 MED ORDER — ACETAMINOPHEN 500 MG PO TABS
1000.0000 mg | ORAL_TABLET | Freq: Four times a day (QID) | ORAL | Status: AC
  Administered 2013-11-16 – 2013-11-17 (×3): 1000 mg via ORAL
  Filled 2013-11-16 (×3): qty 2

## 2013-11-16 MED ORDER — BUPIVACAINE IN DEXTROSE 0.75-8.25 % IT SOLN
INTRATHECAL | Status: DC | PRN
  Administered 2013-11-16: 1.6 mL via INTRATHECAL

## 2013-11-16 MED ORDER — LACTATED RINGERS IV SOLN
INTRAVENOUS | Status: DC
  Administered 2013-11-16 (×2): via INTRAVENOUS

## 2013-11-16 MED ORDER — ZOLPIDEM TARTRATE 5 MG PO TABS: 5.0000 mg | ORAL_TABLET | Freq: Every evening | ORAL | Status: DC | PRN

## 2013-11-16 MED ORDER — IBUPROFEN 600 MG PO TABS
600.0000 mg | ORAL_TABLET | Freq: Four times a day (QID) | ORAL | Status: DC | PRN
  Administered 2013-11-17: 600 mg via ORAL

## 2013-11-16 MED ORDER — PRENATAL MULTIVITAMIN CH
1.0000 | ORAL_TABLET | Freq: Every day | ORAL | Status: DC
  Administered 2013-11-17 – 2013-11-18 (×2): 1 via ORAL
  Filled 2013-11-16 (×2): qty 1

## 2013-11-16 MED ORDER — SENNOSIDES-DOCUSATE SODIUM 8.6-50 MG PO TABS
2.0000 | ORAL_TABLET | ORAL | Status: DC
  Administered 2013-11-17 – 2013-11-18 (×2): 2 via ORAL
  Filled 2013-11-16 (×2): qty 2

## 2013-11-16 MED ORDER — LACTATED RINGERS IV SOLN: INTRAVENOUS | Status: DC

## 2013-11-16 MED ORDER — PHENYLEPHRINE 8 MG IN D5W 100 ML (0.08MG/ML) PREMIX OPTIME
INJECTION | INTRAVENOUS | Status: DC | PRN
  Administered 2013-11-16: 60 ug/min via INTRAVENOUS

## 2013-11-16 MED ORDER — 0.9 % SODIUM CHLORIDE (POUR BTL) OPTIME
TOPICAL | Status: DC | PRN
Start: 2013-11-16 — End: 2013-11-16
  Administered 2013-11-16: 1000 mL

## 2013-11-16 MED ORDER — DIBUCAINE 1 % RE OINT
1.0000 | TOPICAL_OINTMENT | RECTAL | Status: DC | PRN
Start: 2013-11-16 — End: 2013-11-18

## 2013-11-16 MED ORDER — PHENYLEPHRINE HCL 10 MG/ML IJ SOLN
INTRAMUSCULAR | Status: DC | PRN
  Administered 2013-11-16 (×2): 80 ug via INTRAVENOUS

## 2013-11-16 MED ORDER — OXYTOCIN 40 UNITS IN LACTATED RINGERS INFUSION - SIMPLE MED
62.5000 mL/h | Freq: Once | INTRAVENOUS | Status: AC
  Administered 2013-11-16: 62.5 mL/h via INTRAVENOUS

## 2013-11-16 MED ORDER — METOCLOPRAMIDE HCL 5 MG/ML IJ SOLN: 10.0000 mg | Freq: Three times a day (TID) | INTRAMUSCULAR | Status: DC | PRN

## 2013-11-16 MED ORDER — FENTANYL CITRATE 0.05 MG/ML IJ SOLN
INTRAMUSCULAR | Status: AC
  Filled 2013-11-16: qty 2

## 2013-11-16 MED ORDER — MENTHOL 3 MG MT LOZG: 1.0000 | LOZENGE | OROMUCOSAL | Status: DC | PRN

## 2013-11-16 MED ORDER — ONDANSETRON HCL 4 MG/2ML IJ SOLN
INTRAMUSCULAR | Status: DC | PRN
  Administered 2013-11-16: 4 mg via INTRAVENOUS

## 2013-11-16 MED ORDER — FENTANYL CITRATE 0.05 MG/ML IJ SOLN
INTRAMUSCULAR | Status: DC | PRN
  Administered 2013-11-16: 25 ug via INTRATHECAL

## 2013-11-16 MED ORDER — PROMETHAZINE HCL 25 MG/ML IJ SOLN: 6.2500 mg | INTRAMUSCULAR | Status: DC | PRN

## 2013-11-16 MED ORDER — SCOPOLAMINE 1 MG/3DAYS TD PT72
MEDICATED_PATCH | TRANSDERMAL | Status: AC
  Administered 2013-11-16: 1.5 mg via TRANSDERMAL
  Filled 2013-11-16: qty 1

## 2013-11-16 MED ORDER — SODIUM CHLORIDE 0.9 % IJ SOLN: 3.0000 mL | INTRAMUSCULAR | Status: DC | PRN

## 2013-11-16 MED ORDER — ONDANSETRON HCL 4 MG PO TABS: 4.0000 mg | ORAL_TABLET | ORAL | Status: DC | PRN

## 2013-11-16 MED ORDER — KETOROLAC TROMETHAMINE 30 MG/ML IJ SOLN
INTRAMUSCULAR | Status: AC
  Administered 2013-11-16: 30 mg via INTRAVENOUS
  Filled 2013-11-16: qty 1

## 2013-11-16 MED ORDER — CEFAZOLIN SODIUM-DEXTROSE 2-3 GM-% IV SOLR
2.0000 g | INTRAVENOUS | Status: AC
  Administered 2013-11-16: 2 g via INTRAVENOUS

## 2013-11-16 MED ORDER — SIMETHICONE 80 MG PO CHEW
80.0000 mg | CHEWABLE_TABLET | ORAL | Status: DC
  Administered 2013-11-17 – 2013-11-18 (×2): 80 mg via ORAL
  Filled 2013-11-16 (×2): qty 1

## 2013-11-16 MED ORDER — WITCH HAZEL-GLYCERIN EX PADS: 1.0000 "application " | MEDICATED_PAD | CUTANEOUS | Status: DC | PRN

## 2013-11-16 MED ORDER — OXYTOCIN 40 UNITS IN LACTATED RINGERS INFUSION - SIMPLE MED
62.5000 mL/h | INTRAVENOUS | Status: AC
  Administered 2013-11-17: 62.5 mL/h via INTRAVENOUS
  Filled 2013-11-16: qty 1000

## 2013-11-16 MED ORDER — CEFAZOLIN SODIUM-DEXTROSE 2-3 GM-% IV SOLR
INTRAVENOUS | Status: AC
  Filled 2013-11-16: qty 50

## 2013-11-16 MED ORDER — SCOPOLAMINE 1 MG/3DAYS TD PT72
1.0000 | MEDICATED_PATCH | Freq: Once | TRANSDERMAL | Status: DC
  Administered 2013-11-16: 1.5 mg via TRANSDERMAL

## 2013-11-16 MED ORDER — FENTANYL CITRATE 0.05 MG/ML IJ SOLN: 25.0000 ug | INTRAMUSCULAR | Status: DC | PRN

## 2013-11-16 MED ORDER — LACTATED RINGERS IV SOLN
Freq: Once | INTRAVENOUS | Status: AC
  Administered 2013-11-16: 11:00:00 via INTRAVENOUS

## 2013-11-16 SURGICAL SUPPLY — 29 items
BLADE SURG 10 STRL SS (BLADE) ×4 IMPLANT
CLAMP CORD UMBIL (MISCELLANEOUS) IMPLANT
CLOTH BEACON ORANGE TIMEOUT ST (SAFETY) ×2 IMPLANT
DERMABOND ADVANCED (GAUZE/BANDAGES/DRESSINGS)
DERMABOND ADVANCED .7 DNX12 (GAUZE/BANDAGES/DRESSINGS) IMPLANT
DRAPE LG THREE QUARTER DISP (DRAPES) IMPLANT
DRSG OPSITE POSTOP 4X10 (GAUZE/BANDAGES/DRESSINGS) ×2 IMPLANT
DURAPREP 26ML APPLICATOR (WOUND CARE) ×2 IMPLANT
ELECT REM PT RETURN 9FT ADLT (ELECTROSURGICAL) ×2
ELECTRODE REM PT RTRN 9FT ADLT (ELECTROSURGICAL) ×1 IMPLANT
EXTRACTOR VACUUM M CUP 4 TUBE (SUCTIONS) IMPLANT
GLOVE BIO SURGEON STRL SZ7 (GLOVE) ×2 IMPLANT
GOWN STRL REUS W/TWL LRG LVL3 (GOWN DISPOSABLE) ×4 IMPLANT
KIT ABG SYR 3ML LUER SLIP (SYRINGE) IMPLANT
NEEDLE HYPO 25X5/8 SAFETYGLIDE (NEEDLE) IMPLANT
NS IRRIG 1000ML POUR BTL (IV SOLUTION) ×2 IMPLANT
PACK C SECTION WH (CUSTOM PROCEDURE TRAY) ×2 IMPLANT
PAD OB MATERNITY 4.3X12.25 (PERSONAL CARE ITEMS) ×2 IMPLANT
RTRCTR C-SECT PINK 25CM LRG (MISCELLANEOUS) ×2 IMPLANT
STAPLER VISISTAT 35W (STAPLE) IMPLANT
SUT CHROMIC 1 CTX 36 (SUTURE) ×4 IMPLANT
SUT CHROMIC 2 0 CT 1 (SUTURE) ×2 IMPLANT
SUT PDS AB 0 CTX 60 (SUTURE) ×2 IMPLANT
SUT VIC AB 2-0 CT1 27 (SUTURE) ×1
SUT VIC AB 2-0 CT1 TAPERPNT 27 (SUTURE) ×1 IMPLANT
SUT VIC AB 4-0 KS 27 (SUTURE) IMPLANT
TOWEL OR 17X24 6PK STRL BLUE (TOWEL DISPOSABLE) ×2 IMPLANT
TRAY FOLEY CATH 14FR (SET/KITS/TRAYS/PACK) ×2 IMPLANT
WATER STERILE IRR 1000ML POUR (IV SOLUTION) ×2 IMPLANT

## 2013-11-16 NOTE — Transfer of Care (Signed)
Immediate Anesthesia Transfer of Care Note  Patient: Paula Kline  Procedure(s) Performed: Procedure(s): REPEAT CESAREAN SECTION (N/A)  Patient Location: PACU  Anesthesia Type:Spinal  Level of Consciousness: awake  Airway & Oxygen Therapy: Patient Spontanous Breathing  Post-op Assessment: Report given to PACU RN  Post vital signs: Reviewed and stable  Complications: No apparent anesthesia complications

## 2013-11-16 NOTE — Anesthesia Postprocedure Evaluation (Signed)
  Anesthesia Post-op Note  Anesthesia Post Note  Patient: Paula Kline  Procedure(s) Performed: Procedure(s) (LRB): REPEAT CESAREAN SECTION (N/A)  Anesthesia type: Spinal  Patient location: Mother/Baby  Post pain: Pain level controlled  Post assessment: Post-op Vital signs reviewed  Last Vitals:  Filed Vitals:   11/16/13 1406  BP: 122/63  Pulse: 55  Temp: 36.7 C  Resp: 20    Post vital signs: Reviewed  Level of consciousness: awake  Complications: No apparent anesthesia complications

## 2013-11-16 NOTE — Anesthesia Procedure Notes (Signed)
Spinal  Patient location during procedure: OR Start time: 11/16/2013 11:29 AM Staffing Anesthesiologist: Brayton CavesJACKSON, Iesha Summerhill Performed by: anesthesiologist  Preanesthetic Checklist Completed: patient identified, site marked, surgical consent, pre-op evaluation, timeout performed, IV checked, risks and benefits discussed and monitors and equipment checked Spinal Block Patient position: sitting Prep: DuraPrep Patient monitoring: heart rate, cardiac monitor, continuous pulse ox and blood pressure Approach: midline Location: L3-4 Injection technique: single-shot Needle Needle type: Sprotte  Needle gauge: 24 G Needle length: 9 cm Assessment Sensory level: T4 Additional Notes Patient identified.  Risk benefits discussed including failed block, incomplete pain control, headache, nerve damage, paralysis, blood pressure changes, nausea, vomiting, reactions to medication both toxic or allergic, and postpartum back pain.  Patient expressed understanding and wished to proceed.  All questions were answered.  Sterile technique used throughout procedure.  CSF was clear.  No parasthesia or other complications.  Please see nursing notes for vital signs.

## 2013-11-16 NOTE — Op Note (Signed)
Pre-Operative Diagnosis: 1) 39+2 week intrauterine pregnancy 2) History of prior cesarean section, desires repeat Postoperative Diagnosis: Same Procedure: Repeat Cesarean Section Surgeon: Dr. Waynard ReedsKendra Ernestyne Caldwell Assistant: Dr. Luvenia ReddenW. Scott Bowie Operative Findings: Vigorous female infant in the vertex presentation with apgars of 8 & 9.  Specimen: Placenta for disposal EBL: Total I/O In: 3500 [I.V.:3500] Out: 930 [Urine:130; Blood:800]   Procedure:Paula Kline is an 38 year old gravida 2 para 1001 at 8539 weeks and 2 days estimated gestational age who presents for cesarean section. Following the appropriate informed consent the patient was brought to the operating room where spinal anesthesia was administered and found to be adequate. She was placed in the dorsal supine position with a leftward tilt. She was prepped and draped in the normal sterile fashion. Scalpel was then used to make a Pfannenstiel skin incision which was carried down to the underlying layers of soft tissue to the fascia. The fascia was incised in the midline and the fascial incision was extended laterally with Mayo scissors. The superior aspect of the fascial incision was grasped with Coker clamps x2, tented up and the rectus muscles dissected off sharply with the electrocautery unit area and the same procedure was repeated on the inferior aspect of the fascial incision. The rectus muscles were separated in the midline. The abdominal peritoneum was identified, tented up, entered sharply, and the incision was extended superiorly and inferiorly with good visualization of the bladder. The Alexis retractor was then deployed. The vesicouterine peritoneum was identified, tented up, entered sharply, and the bladder flap was created digitally. Scalpel was then used to make a low transverse incision on the uterus which was extended laterally with blunt dissection. The fetal vertex was identified, delivered easily through the uterine incision followed by the  body. The infant was bulb suctioned on the operative field cried vigorously, cord was clamped and cut and the infant was passed to the waiting neonatologist. Placenta was then delivered spontaneously, the uterus was cleared of all clot and debris. The uterine incision was repaired with #1 chromic in running locked fashion followed by a second imbricating layer. Ovaries and tubes were inspected and normal. The Alexis retractor was removed. The uterus was returned to the abdominal cavity the abdominal cavity was cleared of all clot and debris. The abdominal peritoneum was reapproximated with 2-0 Vicryl in a running fashion, the rectus muscles was reapproximated with #1 chromic in a running fashion. The fascia was closed with a looped PDS in a running fashion. The skin was closed with 4-0 vicryl in a subcuticular fashion and Dermabond. All sponge lap and needle counts were correct x2. Patient tolerated the procedure well and recovered in stable condition following the procedure.

## 2013-11-16 NOTE — Anesthesia Preprocedure Evaluation (Addendum)
Anesthesia Evaluation  Patient identified by MRN, date of birth, ID band Patient awake    Reviewed: Allergy & Precautions, H&P , NPO status , Patient's Chart, lab work & pertinent test results  Airway Mallampati: III      Dental   Pulmonary former smoker,  breath sounds clear to auscultation        Cardiovascular Exercise Tolerance: Good Rhythm:regular Rate:Normal     Neuro/Psych  Headaches,  Neuromuscular disease    GI/Hepatic   Endo/Other  Morbid obesity  Renal/GU      Musculoskeletal   Abdominal   Peds  Hematology   Anesthesia Other Findings   Reproductive/Obstetrics (+) Pregnancy                          Anesthesia Physical Anesthesia Plan  ASA: III  Anesthesia Plan: Spinal   Post-op Pain Management:    Induction:   Airway Management Planned:   Additional Equipment:   Intra-op Plan:   Post-operative Plan:   Informed Consent: I have reviewed the patients History and Physical, chart, labs and discussed the procedure including the risks, benefits and alternatives for the proposed anesthesia with the patient or authorized representative who has indicated his/her understanding and acceptance.     Plan Discussed with: Anesthesiologist, CRNA and Surgeon  Anesthesia Plan Comments:        Anesthesia Quick Evaluation

## 2013-11-16 NOTE — H&P (Signed)
Paula PrestoLynette R Prunty is a 38 y.o. female presenting for repeat cesarean section  38 yo G2P1001 @ 39 weeks presents for repeat cesarean section History OB History   Grav Para Term Preterm Abortions TAB SAB Ect Mult Living   5 1 1  3     1      Past Medical History  Diagnosis Date  . Allergic rhinitis   . History of kidney stones     passed stone - no surgery required  . Headache(784.0)     otc med   Past Surgical History  Procedure Laterality Date  . Cesarean section      05/22/12   [place]  . Wisdom tooth extraction     Family History: family history is not on file. Social History:  reports that she quit smoking about 15 years ago. Her smoking use included Cigarettes. She smoked 0.25 packs per day. She has never used smokeless tobacco. She reports that she does not drink alcohol or use illicit drugs.   Prenatal Transfer Tool  Maternal Diabetes: No Genetic Screening: Declined Maternal Ultrasounds/Referrals: Normal Fetal Ultrasounds or other Referrals:  None Maternal Substance Abuse:  No Significant Maternal Medications:  None Significant Maternal Lab Results:  None Other Comments:  None  ROS    Blood pressure 115/60, pulse 76, temperature 97.5 F (36.4 C), temperature source Oral, resp. rate 20, SpO2 98.00%. Exam Physical Exam  Prenatal labs: ABO, Rh: --/--/A POS (07/27 1350) Antibody: NEG (07/27 1350) Rubella:  Immune RPR: NON REAC (07/27 1350)  HBsAg:   Negative HIV:   Negative GBS:   Negative  Assessment/Plan: 1) Admit 2) Repeat cesarean. R/B/A reviewed   Wright Gravely H. 11/16/2013, 11:16 AM

## 2013-11-17 ENCOUNTER — Encounter (HOSPITAL_COMMUNITY): Payer: Self-pay | Admitting: Obstetrics and Gynecology

## 2013-11-17 LAB — CBC
HEMATOCRIT: 30.9 % — AB (ref 36.0–46.0)
Hemoglobin: 10.3 g/dL — ABNORMAL LOW (ref 12.0–15.0)
MCH: 28.1 pg (ref 26.0–34.0)
MCHC: 33.3 g/dL (ref 30.0–36.0)
MCV: 84.4 fL (ref 78.0–100.0)
Platelets: 134 10*3/uL — ABNORMAL LOW (ref 150–400)
RBC: 3.66 MIL/uL — AB (ref 3.87–5.11)
RDW: 14.7 % (ref 11.5–15.5)
WBC: 10.2 10*3/uL (ref 4.0–10.5)

## 2013-11-17 LAB — BIRTH TISSUE RECOVERY COLLECTION (PLACENTA DONATION)

## 2013-11-17 MED ORDER — LACTATED RINGERS IV BOLUS (SEPSIS)
500.0000 mL | Freq: Once | INTRAVENOUS | Status: AC
  Administered 2013-11-17: 07:00:00 via INTRAVENOUS

## 2013-11-17 MED ORDER — IBUPROFEN 600 MG PO TABS
600.0000 mg | ORAL_TABLET | Freq: Four times a day (QID) | ORAL | Status: DC
  Administered 2013-11-17 – 2013-11-18 (×4): 600 mg via ORAL
  Filled 2013-11-17 (×4): qty 1

## 2013-11-17 NOTE — Progress Notes (Signed)
Upon assessment of baby, noted mother without infant ID band bracelet. ID bracelet with infant band number placed on mother's wrist after verification with support person, baby, and mother's current ID band.

## 2013-11-17 NOTE — Progress Notes (Signed)
Subjective: Postpartum Day 1: Cesarean Delivery Patient reports mild incisional pain and tolerating PO.  She  Has been out of bed without difficulty.   Objective: Vital signs in last 24 hours: Temp:  [97.5 F (36.4 C)-98.5 F (36.9 C)] 97.9 F (36.6 C) (07/29 0525) Pulse Rate:  [45-76] 64 (07/29 0525) Resp:  [18-22] 18 (07/29 0525) BP: (88-122)/(31-67) 104/42 mmHg (07/29 0525) SpO2:  [93 %-98 %] 97 % (07/29 0525) Weight:  [114.306 kg (252 lb)] 114.306 kg (252 lb) (07/28 1429)  Physical Exam:  General: alert, cooperative and no distress Lochia: appropriate Uterine Fundus: firm  Incision: healing well, no significant drainage, no dehiscence, no significant erythema DVT Evaluation: No evidence of DVT seen on physical exam. Negative Homan's sign. No cords or calf tenderness.   Recent Labs  11/15/13 1350 11/17/13 0545  HGB 12.3 10.3*  HCT 36.0 30.9*    Assessment/Plan: Status post Cesarean section. Doing well postoperatively.  Continue current care. There was a decrease in UOP overnight so foley left in place.  Will closely watch and D/C foley today   Jolinda Pinkstaff STACIA 11/17/2013, 8:59 AM

## 2013-11-17 NOTE — Lactation Note (Signed)
This note was copied from the chart of Girl Carlyon ProwsLynette Cobaugh. Lactation Consultation Note Initial visit 29 hours of age.  & recorded feeding with 5 voids and 5 stools. Baby has had several long breaks between feedings that mom reports they were both asleep.  Baby is latched to right breast in football hold now.  Mom is using shells for flat nipples and reports a little pain with latch on.  Audible swallows heard across the room.  Baby maintained latch for about 20 minutes and stopped before slipping off causing nipple compression, discussed how to prevent this with mom.  Hand expression on right breast does not reveal colostrum at this time.  Mom attempts to latch on left breast and baby is sleepy.  [facility] resources given and discussed.  Encouraged to feed with early cues on demand. Feeding frequency discussed.  Early newborn behavior discussed.   Mom to call for assist as needed.     Patient Name: Girl Carlyon ProwsLynette Manley EAVWU'JToday's Date: 11/17/2013 Reason for consult: Initial assessment   Maternal Data Has patient been taught Hand Expression?: Yes Does the patient have breastfeeding experience prior to this delivery?: Yes  Feeding Feeding Type: Breast Fed Length of feed: 0 min  LATCH Score/Interventions Latch: Grasps breast easily, tongue down, lips flanged, rhythmical sucking. Intervention(s): Adjust position;Assist with latch;Breast massage;Breast compression  Audible Swallowing: Spontaneous and intermittent Intervention(s): Skin to skin;Hand expression  Type of Nipple: Flat Intervention(s): Shells  Comfort (Breast/Nipple): Soft / non-tender  Problem noted: Mild/Moderate discomfort Interventions (Mild/moderate discomfort): Comfort gels  Hold (Positioning): Assistance needed to correctly position infant at breast and maintain latch. Intervention(s): Position options;Skin to skin;Support Pillows;Breastfeeding basics reviewed  LATCH Score: 8  Lactation Tools Discussed/Used Tools:  Shells   Consult Status Consult Status: Follow-up Date: 11/18/13 Follow-up type: In-patient    Shoptaw, Arvella MerlesJana Lynn 11/17/2013, 5:55 PM

## 2013-11-17 NOTE — Progress Notes (Signed)
Telephone report to Dr. Henderson CloudHorvath related to patient's urine output of 185ml in last 5 hours. Orders received to give bolus IVF of 500ml. If output improves, may d/c foley.

## 2013-11-18 MED ORDER — IBUPROFEN 600 MG PO TABS: 600.0000 mg | ORAL_TABLET | Freq: Four times a day (QID) | ORAL | Status: AC | PRN

## 2013-11-18 MED ORDER — DOCUSATE SODIUM 100 MG PO CAPS: 100.0000 mg | ORAL_CAPSULE | Freq: Two times a day (BID) | ORAL | Status: AC

## 2013-11-18 MED ORDER — OXYCODONE-ACETAMINOPHEN 5-325 MG PO TABS: 1.0000 | ORAL_TABLET | ORAL | Status: AC | PRN

## 2013-11-18 NOTE — Discharge Summary (Signed)
Obstetric Discharge Summary Reason for Admission: cesarean section Prenatal Procedures: NST and ultrasound Intrapartum Procedures: cesarean: low cervical, transverse Postpartum Procedures: none Complications-Operative and Postpartum: none Hemoglobin  Date Value Ref Range Status  11/17/2013 10.3* 12.0 - 15.0 g/dL Final     HCT  Date Value Ref Range Status  11/17/2013 30.9* 36.0 - 46.0 % Final    Physical Exam:  General: alert, cooperative and appears stated age 30Lochia: appropriate Uterine Fundus: firm Incision: healing well DVT Evaluation: No evidence of DVT seen on physical exam.  Discharge Diagnoses: Term Pregnancy-delivered  Discharge Information: Date: 11/18/2013 Activity: pelvic rest Diet: routine Medications: Ibuprofen, Colace and Percocet Condition: improved Instructions: refer to practice specific booklet Discharge to: home Follow-up Information   Follow up with Almon HerculesOSS,Woodie Degraffenreid H., MD In 4 weeks. (For a postoperative evaluation)    Specialty:  Obstetrics and Gynecology   Contact information:   [address] [place] [id number] [phone]       Newborn Data: Live born female  Birth Weight: 9 lb 0.3 oz (4090 g) APGAR: 9, 9  Home with mother.  [place] Filice H. 11/18/2013, 8:36 AM

## 2013-11-19 LAB — TYPE AND SCREEN
ABO/RH(D): A POS
Antibody Screen: NEGATIVE
UNIT DIVISION: 0
Unit division: 0

## 2013-12-13 ENCOUNTER — Other Ambulatory Visit: Payer: Self-pay | Admitting: Obstetrics and Gynecology

## 2013-12-14 LAB — CYTOLOGY - PAP

## 2014-02-21 ENCOUNTER — Encounter (HOSPITAL_COMMUNITY): Payer: Self-pay | Admitting: Obstetrics and Gynecology

## 2014-12-13 ENCOUNTER — Ambulatory Visit: Payer: Self-pay | Admitting: Adult Health

## 2015-01-26 ENCOUNTER — Ambulatory Visit: Payer: Self-pay | Admitting: Adult Health

## 2015-03-26 ENCOUNTER — Encounter (HOSPITAL_COMMUNITY): Payer: Self-pay | Admitting: Emergency Medicine

## 2015-03-26 ENCOUNTER — Emergency Department (INDEPENDENT_AMBULATORY_CARE_PROVIDER_SITE_OTHER)
Admission: EM | Admit: 2015-03-26 | Discharge: 2015-03-26 | Disposition: A | Discharge status: Home / Self Care | Payer: 59 | Source: Home / Self Care | Attending: Emergency Medicine | Admitting: Emergency Medicine

## 2015-03-26 DIAGNOSIS — S8002XA Contusion of left knee, initial encounter: Secondary | ICD-10-CM | POA: Diagnosis not present

## 2015-03-26 DIAGNOSIS — M25562 Pain in left knee: Secondary | ICD-10-CM

## 2015-03-26 MED ORDER — ETODOLAC 500 MG PO TABS: 500.0000 mg | ORAL_TABLET | Freq: Two times a day (BID) | ORAL | Status: AC

## 2015-03-26 NOTE — Discharge Instructions (Signed)
Knee Pain Knee pain is a very common symptom and can have many causes. Knee pain often goes away when you follow your health care provider's instructions for relieving pain and discomfort at home. However, knee pain can develop into a condition that needs treatment. Some conditions may include:  Arthritis caused by wear and tear (osteoarthritis).  Arthritis caused by swelling and irritation (rheumatoid arthritis or gout).  A cyst or growth in your knee.  An infection in your knee joint.  An injury that will not heal.  Damage, swelling, or irritation of the tissues that support your knee (torn ligaments or tendinitis). If your knee pain continues, additional tests may be ordered to diagnose your condition. Tests may include X-rays or other imaging studies of your knee. You may also need to have fluid removed from your knee. Treatment for ongoing knee pain depends on the cause, but treatment may include:  Medicines to relieve pain or swelling.  Steroid injections in your knee.  Physical therapy.  Surgery. HOME CARE INSTRUCTIONS  Take medicines only as directed by your health care provider.  Rest your knee and keep it raised (elevated) while you are resting.  Do not do things that cause or worsen pain.  Avoid high-impact activities or exercises, such as running, jumping rope, or doing jumping jacks.  Apply ice to the knee area:  Put ice in a plastic bag.  Place a towel between your skin and the bag.  Leave the ice on for 20 minutes, 2-3 times a day.  Ask your health care provider if you should wear an elastic knee support.  Keep a pillow under your knee when you sleep.  Lose weight if you are overweight. Extra weight can put pressure on your knee.  Do not use any tobacco products, including cigarettes, chewing tobacco, or electronic cigarettes. If you need help quitting, ask your health care provider. Smoking may slow the healing of any bone and joint problems that you may  have. SEEK MEDICAL CARE IF:  Your knee pain continues, changes, or gets worse.  You have a fever along with knee pain.  Your knee buckles or locks up.  Your knee becomes more swollen. SEEK IMMEDIATE MEDICAL CARE IF:   Your knee joint feels hot to the touch.  You have chest pain or trouble breathing.   This information is not intended to replace advice given to you by your health care provider. Make sure you discuss any questions you have with your health care provider.  Suspect you have a contusion to the knee based on exam and injury. Suggest wearing the immobilizer when ambulatory, do not have to sleep in it. Use the meds twice a day and interim with tylenol. F/U with ortho if needed.    Document Released: 02/03/2007 Document Revised: 04/29/2014 Document Reviewed: 11/22/2013 Elsevier Interactive Patient Education [employer]2016 [employer]

## 2015-03-26 NOTE — ED Notes (Signed)
Left knee pain--patient ran into a trailer hitch on her vehicle.  Reports pain around knee cap.  Occurred today.  Patient reports having difficulty straightening leg completely and difficulty bearing weight on left knee.  Patient also reports falling a week ago and injuring right foot.  By shifting weight off knee, more weight is riding on right foot and has shooting pain this afternoon.

## 2015-03-26 NOTE — ED Provider Notes (Signed)
CSN: [id number]     Arrival date & time 03/26/15  1932 History   First MD Initiated Contact with Patient 03/26/15 1945     Chief Complaint  Patient presents with  . Knee Pain   (Consider location/radiation/quality/duration/timing/severity/associated sxs/prior Treatment) HPI Comments: Patient presents with left knee pain after hitting the front of the knee with the trailer hitch on her SUV. The pain was immediate and has improved only minimal with Ibuprofen. She denies any swelling, just pain along the front aspect.   Patient is a 39 y.o. female presenting with knee pain. The history is provided by the patient.  Knee Pain   Past Medical History  Diagnosis Date  . Allergic rhinitis   . History of kidney stones     passed stone - no surgery required  . Headache(784.0)     otc med   Past Surgical History  Procedure Laterality Date  . Cesarean section      05/22/12   [place]  . Wisdom tooth extraction    . Cesarean section N/A 11/16/2013    Procedure: REPEAT CESAREAN SECTION;  Surgeon: Freddrick MarchKendra H. Tenny Crawoss, MD;  Location: [facility];  Service: Obstetrics;  Laterality: N/A;   No family history on file. Social History  Substance Use Topics  . Smoking status: Former Smoker -- 0.25 packs/day    Types: Cigarettes    Quit date: 04/22/1998  . Smokeless tobacco: Never Used  . Alcohol Use: No   OB History    Gravida Para Term Preterm AB TAB SAB Ectopic Multiple Living   5 2 2  3     2      Review of Systems  All other systems reviewed and are negative.   Allergies  Morphine and related  Home Medications   Prior to Admission medications   Medication Sig Start Date End Date Taking? Authorizing Provider  docusate sodium (COLACE) 100 MG capsule Take 300 mg by mouth at bedtime.    Historical Provider, MD  docusate sodium (COLACE) 100 MG capsule Take 1 capsule (100 mg total) by mouth 2 (two) times daily. 11/18/13   Waynard ReedsKendra Ross, MD  etodolac (LODINE) 500 MG tablet Take 1 tablet (500 mg  total) by mouth 2 (two) times daily. 03/26/15   Riki SheerMichelle G Young, PA-C  folic acid (FOLVITE) 800 MCG tablet Take 400 mcg by mouth daily.      Historical Provider, MD  hydrocortisone 2.5 % cream Apply 1 application topically 2 (two) times daily.    Historical Provider, MD  ibuprofen (ADVIL,MOTRIN) 600 MG tablet Take 1 tablet (600 mg total) by mouth every 6 (six) hours as needed. 11/18/13   Waynard ReedsKendra Ross, MD  oxyCODONE-acetaminophen (ROXICET) 5-325 MG per tablet Take 1-2 tablets by mouth every 4 (four) hours as needed for severe pain. 11/18/13   Waynard ReedsKendra Ross, MD  Prenatal Vit-Fe Fumarate-FA (PRENATAL MULTIVITAMIN) TABS tablet Take 1 tablet by mouth daily at 12 noon.    Historical Provider, MD   Meds Ordered and Administered this Visit  Medications - No data to display  BP 135/81 mmHg  Pulse 81  Temp(Src) 98.3 F (36.8 C) (Oral)  SpO2 96% No data found.   Physical Exam  Constitutional: She is oriented to person, place, and time. She appears well-developed and well-nourished. No distress.  Pulmonary/Chest: Effort normal.  Musculoskeletal:  No swelling or deformities to the left knee. Pain along the patella tendon with light palpation. No areas of eccymosis noted. No effusion. Full, but slow ROM and slow  gait.  Neurological: She is alert and oriented to person, place, and time.  Skin: Skin is warm and dry. No rash noted. She is not diaphoretic.  Psychiatric: Her behavior is normal.  Nursing note and vitals reviewed.   ED Course  Procedures (including critical care time)  Labs Review Labs Reviewed - No data to display  Imaging Review No results found.   Visual Acuity Review  Right Eye Distance:   Left Eye Distance:   Bilateral Distance:    Right Eye Near:   Left Eye Near:    Bilateral Near:         MDM   1. Knee contusion, left, initial encounter   2. Knee pain, acute, left    No indication for xray based on exam and mechanism of injury. Suspect contusion only. Placed in  immobilizer with instructed use of NSAIDS.     Riki Sheer, PA-C 03/26/15 2023

## 2015-05-03 ENCOUNTER — Other Ambulatory Visit: Payer: Self-pay | Admitting: Obstetrics and Gynecology

## 2015-05-04 LAB — CYTOLOGY - PAP

## 2016-02-07 DIAGNOSIS — J302 Other seasonal allergic rhinitis: Secondary | ICD-10-CM | POA: Diagnosis not present

## 2016-02-07 DIAGNOSIS — Z13220 Encounter for screening for lipoid disorders: Secondary | ICD-10-CM | POA: Diagnosis not present

## 2016-02-07 DIAGNOSIS — Z131 Encounter for screening for diabetes mellitus: Secondary | ICD-10-CM | POA: Diagnosis not present

## 2016-02-07 DIAGNOSIS — Z6838 Body mass index (BMI) 38.0-38.9, adult: Secondary | ICD-10-CM | POA: Diagnosis not present

## 2016-10-29 DIAGNOSIS — M7731 Calcaneal spur, right foot: Secondary | ICD-10-CM | POA: Diagnosis not present

## 2016-10-29 DIAGNOSIS — M722 Plantar fascial fibromatosis: Secondary | ICD-10-CM | POA: Diagnosis not present

## 2016-10-29 DIAGNOSIS — M76821 Posterior tibial tendinitis, right leg: Secondary | ICD-10-CM | POA: Diagnosis not present

## 2016-10-29 DIAGNOSIS — M71571 Other bursitis, not elsewhere classified, right ankle and foot: Secondary | ICD-10-CM | POA: Diagnosis not present

## 2016-11-07 DIAGNOSIS — M71571 Other bursitis, not elsewhere classified, right ankle and foot: Secondary | ICD-10-CM | POA: Diagnosis not present

## 2016-11-07 DIAGNOSIS — M722 Plantar fascial fibromatosis: Secondary | ICD-10-CM | POA: Diagnosis not present

## 2016-11-12 ENCOUNTER — Ambulatory Visit: Payer: 59 | Admitting: Podiatry

## 2017-04-08 DIAGNOSIS — Z01419 Encounter for gynecological examination (general) (routine) without abnormal findings: Secondary | ICD-10-CM | POA: Diagnosis not present

## 2017-04-08 DIAGNOSIS — Z124 Encounter for screening for malignant neoplasm of cervix: Secondary | ICD-10-CM | POA: Diagnosis not present

## 2017-04-08 DIAGNOSIS — Z6841 Body Mass Index (BMI) 40.0 and over, adult: Secondary | ICD-10-CM | POA: Diagnosis not present

## 2017-04-09 DIAGNOSIS — M722 Plantar fascial fibromatosis: Secondary | ICD-10-CM | POA: Diagnosis not present

## 2017-04-09 DIAGNOSIS — M76821 Posterior tibial tendinitis, right leg: Secondary | ICD-10-CM | POA: Diagnosis not present

## 2017-04-09 DIAGNOSIS — M71571 Other bursitis, not elsewhere classified, right ankle and foot: Secondary | ICD-10-CM | POA: Diagnosis not present

## 2017-08-07 DIAGNOSIS — Z6841 Body Mass Index (BMI) 40.0 and over, adult: Secondary | ICD-10-CM | POA: Diagnosis not present

## 2017-08-07 DIAGNOSIS — E663 Overweight: Secondary | ICD-10-CM | POA: Diagnosis not present

## 2017-09-22 DIAGNOSIS — J069 Acute upper respiratory infection, unspecified: Secondary | ICD-10-CM | POA: Diagnosis not present

## 2017-09-22 DIAGNOSIS — Z6841 Body Mass Index (BMI) 40.0 and over, adult: Secondary | ICD-10-CM | POA: Diagnosis not present

## 2017-09-22 DIAGNOSIS — J040 Acute laryngitis: Secondary | ICD-10-CM | POA: Diagnosis not present

## 2017-12-05 DIAGNOSIS — Z6841 Body Mass Index (BMI) 40.0 and over, adult: Secondary | ICD-10-CM | POA: Diagnosis not present

## 2017-12-05 DIAGNOSIS — J069 Acute upper respiratory infection, unspecified: Secondary | ICD-10-CM | POA: Diagnosis not present

## 2018-04-24 DIAGNOSIS — D225 Melanocytic nevi of trunk: Secondary | ICD-10-CM | POA: Diagnosis not present

## 2018-04-24 DIAGNOSIS — D2262 Melanocytic nevi of left upper limb, including shoulder: Secondary | ICD-10-CM | POA: Diagnosis not present

## 2018-04-24 DIAGNOSIS — D485 Neoplasm of uncertain behavior of skin: Secondary | ICD-10-CM | POA: Diagnosis not present

## 2018-04-24 DIAGNOSIS — D2261 Melanocytic nevi of right upper limb, including shoulder: Secondary | ICD-10-CM | POA: Diagnosis not present

## 2018-04-24 DIAGNOSIS — D2372 Other benign neoplasm of skin of left lower limb, including hip: Secondary | ICD-10-CM | POA: Diagnosis not present

## 2018-06-04 DIAGNOSIS — Z13220 Encounter for screening for lipoid disorders: Secondary | ICD-10-CM | POA: Diagnosis not present

## 2018-06-04 DIAGNOSIS — Z1329 Encounter for screening for other suspected endocrine disorder: Secondary | ICD-10-CM | POA: Diagnosis not present

## 2018-06-04 DIAGNOSIS — Z114 Encounter for screening for human immunodeficiency virus [HIV]: Secondary | ICD-10-CM | POA: Diagnosis not present

## 2018-06-04 DIAGNOSIS — Z0000 Encounter for general adult medical examination without abnormal findings: Secondary | ICD-10-CM | POA: Diagnosis not present

## 2018-06-08 DIAGNOSIS — Z0000 Encounter for general adult medical examination without abnormal findings: Secondary | ICD-10-CM | POA: Diagnosis not present

## 2018-06-08 DIAGNOSIS — J069 Acute upper respiratory infection, unspecified: Secondary | ICD-10-CM | POA: Diagnosis not present

## 2018-06-08 DIAGNOSIS — Z6841 Body Mass Index (BMI) 40.0 and over, adult: Secondary | ICD-10-CM | POA: Diagnosis not present

## 2018-06-08 DIAGNOSIS — Z23 Encounter for immunization: Secondary | ICD-10-CM | POA: Diagnosis not present

## 2018-06-30 ENCOUNTER — Other Ambulatory Visit: Payer: Self-pay | Admitting: Family Medicine

## 2018-06-30 DIAGNOSIS — Z1231 Encounter for screening mammogram for malignant neoplasm of breast: Secondary | ICD-10-CM

## 2018-07-03 DIAGNOSIS — Z6841 Body Mass Index (BMI) 40.0 and over, adult: Secondary | ICD-10-CM | POA: Diagnosis not present

## 2018-07-03 DIAGNOSIS — B353 Tinea pedis: Secondary | ICD-10-CM | POA: Diagnosis not present

## 2018-07-03 DIAGNOSIS — N390 Urinary tract infection, site not specified: Secondary | ICD-10-CM | POA: Diagnosis not present

## 2018-07-03 DIAGNOSIS — R350 Frequency of micturition: Secondary | ICD-10-CM | POA: Diagnosis not present

## 2018-07-16 DIAGNOSIS — N390 Urinary tract infection, site not specified: Secondary | ICD-10-CM | POA: Diagnosis not present

## 2018-07-16 DIAGNOSIS — Z719 Counseling, unspecified: Secondary | ICD-10-CM | POA: Diagnosis not present

## 2018-07-16 DIAGNOSIS — Z733 Stress, not elsewhere classified: Secondary | ICD-10-CM | POA: Diagnosis not present

## 2018-09-16 DIAGNOSIS — Z6841 Body Mass Index (BMI) 40.0 and over, adult: Secondary | ICD-10-CM | POA: Diagnosis not present

## 2018-09-16 DIAGNOSIS — Z01419 Encounter for gynecological examination (general) (routine) without abnormal findings: Secondary | ICD-10-CM | POA: Diagnosis not present

## 2018-09-16 DIAGNOSIS — Z1231 Encounter for screening mammogram for malignant neoplasm of breast: Secondary | ICD-10-CM | POA: Diagnosis not present

## 2018-09-16 DIAGNOSIS — Z124 Encounter for screening for malignant neoplasm of cervix: Secondary | ICD-10-CM | POA: Diagnosis not present

## 2018-09-16 DIAGNOSIS — R87610 Atypical squamous cells of undetermined significance on cytologic smear of cervix (ASC-US): Secondary | ICD-10-CM | POA: Diagnosis not present

## 2018-12-15 DIAGNOSIS — R05 Cough: Secondary | ICD-10-CM | POA: Diagnosis not present

## 2018-12-15 DIAGNOSIS — Z20828 Contact with and (suspected) exposure to other viral communicable diseases: Secondary | ICD-10-CM | POA: Diagnosis not present

## 2018-12-15 DIAGNOSIS — J069 Acute upper respiratory infection, unspecified: Secondary | ICD-10-CM | POA: Diagnosis not present

## 2018-12-24 DIAGNOSIS — Z30433 Encounter for removal and reinsertion of intrauterine contraceptive device: Secondary | ICD-10-CM | POA: Diagnosis not present

## 2018-12-24 DIAGNOSIS — Z6841 Body Mass Index (BMI) 40.0 and over, adult: Secondary | ICD-10-CM | POA: Diagnosis not present

## 2018-12-29 DIAGNOSIS — Z23 Encounter for immunization: Secondary | ICD-10-CM | POA: Diagnosis not present

## 2019-02-22 ENCOUNTER — Other Ambulatory Visit: Payer: Self-pay

## 2019-02-22 DIAGNOSIS — Z20822 Contact with and (suspected) exposure to covid-19: Secondary | ICD-10-CM

## 2019-02-23 LAB — NOVEL CORONAVIRUS, NAA: SARS-CoV-2, NAA: DETECTED — AB

## 2019-05-10 DIAGNOSIS — M19041 Primary osteoarthritis, right hand: Secondary | ICD-10-CM | POA: Diagnosis not present

## 2019-06-10 DIAGNOSIS — M67441 Ganglion, right hand: Secondary | ICD-10-CM | POA: Diagnosis not present

## 2019-06-10 DIAGNOSIS — M19041 Primary osteoarthritis, right hand: Secondary | ICD-10-CM | POA: Diagnosis not present

## 2019-07-23 DIAGNOSIS — R2231 Localized swelling, mass and lump, right upper limb: Secondary | ICD-10-CM | POA: Diagnosis not present

## 2019-07-23 DIAGNOSIS — M79641 Pain in right hand: Secondary | ICD-10-CM | POA: Diagnosis not present

## 2019-08-16 DIAGNOSIS — M25562 Pain in left knee: Secondary | ICD-10-CM | POA: Diagnosis not present

## 2023-02-20 ENCOUNTER — Other Ambulatory Visit (HOSPITAL_COMMUNITY): Payer: Self-pay

## 2023-02-20 MED ORDER — WEGOVY 0.25 MG/0.5ML ~~LOC~~ SOAJ
0.2500 mg | SUBCUTANEOUS | 0 refills | Status: AC
  Filled 2023-02-20 – 2023-03-12 (×4): qty 2, 28d supply, fill #0

## 2023-03-06 ENCOUNTER — Other Ambulatory Visit (HOSPITAL_COMMUNITY): Payer: Self-pay

## 2023-03-12 ENCOUNTER — Other Ambulatory Visit (HOSPITAL_COMMUNITY): Payer: Self-pay

## 2023-04-22 ENCOUNTER — Other Ambulatory Visit (HOSPITAL_COMMUNITY): Payer: Self-pay

## 2023-04-22 MED ORDER — WEGOVY 0.5 MG/0.5ML ~~LOC~~ SOAJ
0.5000 mg | SUBCUTANEOUS | 0 refills | Status: AC
  Filled 2023-04-22: qty 2, 28d supply, fill #0

## 2023-04-22 MED ORDER — HYDROXYZINE HCL 25 MG PO TABS
25.0000 mg | ORAL_TABLET | ORAL | 0 refills | Status: AC | PRN
  Filled 2023-04-22: qty 30, 5d supply, fill #0

## 2023-04-24 ENCOUNTER — Other Ambulatory Visit (HOSPITAL_COMMUNITY): Payer: Self-pay
